# Patient Record
Sex: Female | Born: 2000 | Race: White | Hispanic: No | Marital: Single | Smoking: Never smoker
Health system: Southern US, Community
[De-identification: ages and names within clinical notes are randomized; demographics above are authoritative.]

## PROBLEM LIST (undated history)

## (undated) DIAGNOSIS — A6920 Lyme disease, unspecified: Secondary | ICD-10-CM

## (undated) DIAGNOSIS — H5500 Unspecified nystagmus: Secondary | ICD-10-CM

## (undated) DIAGNOSIS — G43909 Migraine, unspecified, not intractable, without status migrainosus: Secondary | ICD-10-CM

## (undated) DIAGNOSIS — G930 Cerebral cysts: Secondary | ICD-10-CM

## (undated) DIAGNOSIS — F909 Attention-deficit hyperactivity disorder, unspecified type: Secondary | ICD-10-CM

## (undated) DIAGNOSIS — F0781 Postconcussional syndrome: Secondary | ICD-10-CM

## (undated) DIAGNOSIS — R000 Tachycardia, unspecified: Secondary | ICD-10-CM

## (undated) HISTORY — PX: OTHER SURGICAL HISTORY: SHX169

---
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
## 2018-01-29 DIAGNOSIS — J3081 Allergic rhinitis due to animal (cat) (dog) hair and dander: Secondary | ICD-10-CM | POA: Insufficient documentation

## 2018-01-29 DIAGNOSIS — J4530 Mild persistent asthma, uncomplicated: Secondary | ICD-10-CM | POA: Insufficient documentation

## 2019-02-14 ENCOUNTER — Ambulatory Visit: Payer: Self-pay | Admitting: Internal Medicine

## 2019-02-16 DIAGNOSIS — F909 Attention-deficit hyperactivity disorder, unspecified type: Secondary | ICD-10-CM | POA: Insufficient documentation

## 2019-07-01 DIAGNOSIS — H532 Diplopia: Secondary | ICD-10-CM | POA: Insufficient documentation

## 2019-07-01 DIAGNOSIS — H538 Other visual disturbances: Secondary | ICD-10-CM | POA: Insufficient documentation

## 2019-07-01 DIAGNOSIS — R519 Headache, unspecified: Secondary | ICD-10-CM | POA: Insufficient documentation

## 2019-08-23 ENCOUNTER — Other Ambulatory Visit: Payer: Self-pay

## 2019-08-23 ENCOUNTER — Ambulatory Visit: Payer: Self-pay | Admitting: Family Medicine

## 2019-08-23 ENCOUNTER — Encounter: Payer: Self-pay | Admitting: Family Medicine

## 2019-08-23 DIAGNOSIS — Z113 Encounter for screening for infections with a predominantly sexual mode of transmission: Secondary | ICD-10-CM

## 2019-08-23 LAB — WET PREP FOR TRICH, YEAST, CLUE
Trichomonas Exam: NEGATIVE
Yeast Exam: NEGATIVE

## 2019-08-23 NOTE — Progress Notes (Signed)
[place]et [place] results reviewed. No treatment indicated per standing orders. Muriel Hannold, RN  

## 2019-08-23 NOTE — Progress Notes (Signed)
[facility] Department STI clinic/screening visit  Subjective:  Katrina Collins is a 19 y.o. female being seen today for No chief complaint on file.    The patient reports they do not have symptoms. Patient reports that they do not desire a pregnancy in the next year. They reported they are not interested in discussing contraception today.   Patient has the following medical conditions:   Patient Active Problem List   Diagnosis Date Noted  . Blurred vision 07/01/2019  . Diplopia 07/01/2019  . Headache disorder 07/01/2019  . Allergic rhinitis due to animal hair and dander 01/29/2018  . Mild persistent asthma without complication 01/29/2018    HPI  Pt reports she is here for STI screening. Denies symptoms.   See flowsheet for further details and programmatic requirements.    No LMP recorded (approximate). Last sex: May 28 BCM: Nexplanon Desires EC? n/a  Last pap per pt/review of record: never - states she will see PCP for this Last HIV test per pt/review of record: 2019  Last tetanus vaccine: a few years ago per pt   No components found for: HCV  The following portions of the patient's history were reviewed and updated as appropriate: allergies, current medications, past medical history, past social history, past surgical history and problem list.  Objective:  There were no vitals filed for this visit.   Physical Exam Vitals and nursing note reviewed.  Constitutional:      Appearance: Normal appearance.  HENT:     Head: Normocephalic and atraumatic.     Mouth/Throat:     Mouth: Mucous membranes are moist.     Pharynx: Oropharynx is clear. No oropharyngeal exudate or posterior oropharyngeal erythema.  Pulmonary:     Effort: Pulmonary effort is normal.  Abdominal:     General: Abdomen is flat.     Palpations: There is no mass.     Tenderness: There is no abdominal tenderness. There is no rebound.  Genitourinary:    General: Normal vulva.     Exam  position: Lithotomy position.     Pubic Area: No rash or pubic lice.      Labia:        Right: No rash or lesion.        Left: No rash or lesion.      Vagina: Vaginal discharge (scant, white, curdlike, ph<4.5) present. No erythema, bleeding or lesions.     Cervix: No cervical motion tenderness, discharge, friability, lesion or erythema.     Uterus: Normal.      Adnexa: Right adnexa normal and left adnexa normal.     Rectum: Normal.  Lymphadenopathy:     Head:     Right side of head: No preauricular or posterior auricular adenopathy.     Left side of head: No preauricular or posterior auricular adenopathy.     Cervical: No cervical adenopathy.     Upper Body:     Right upper body: No supraclavicular or axillary adenopathy.     Left upper body: No supraclavicular or axillary adenopathy.     Lower Body: No right inguinal adenopathy. No left inguinal adenopathy.  Skin:    General: Skin is warm and dry.     Findings: No rash.  Neurological:     Mental Status: She is alert and oriented to person, place, and time.      Assessment and Plan:  Katrina Collins is a 19 y.o. female presenting to the [facility] Department for STI screening  1. Screening examination for venereal disease -Pt without symptoms. Screenings today as below. Treat wet prep per standing order. -Patient does not meet criteria for HepB, HepC Screening.  -Counseled on warning s/sx and when to seek care. Recommended condom use with all sex and discussed importance of condom use for STI prevention. - WET PREP FOR [place], YEAST, CLUE - Gonococcus culture - HIV [place] [facility] - Syphilis Serology, [place] [facility] - Chlamydia/Gonorrhea [place] [facility]     Return for screening as needed.  No future appointments.  Kandee Keen, PA-C

## 2019-08-27 LAB — GONOCOCCUS CULTURE

## 2019-12-30 ENCOUNTER — Encounter: Payer: Self-pay | Admitting: Emergency Medicine

## 2019-12-30 ENCOUNTER — Emergency Department
Admission: EM | Admit: 2019-12-30 | Discharge: 2019-12-30 | Disposition: A | Discharge status: Home / Self Care | Payer: BC Managed Care – PPO | Attending: Student in an Organized Health Care Education/Training Program | Admitting: Student in an Organized Health Care Education/Training Program

## 2019-12-30 ENCOUNTER — Other Ambulatory Visit: Payer: Self-pay

## 2019-12-30 DIAGNOSIS — J4530 Mild persistent asthma, uncomplicated: Secondary | ICD-10-CM | POA: Insufficient documentation

## 2019-12-30 DIAGNOSIS — F909 Attention-deficit hyperactivity disorder, unspecified type: Secondary | ICD-10-CM | POA: Insufficient documentation

## 2019-12-30 DIAGNOSIS — G43119 Migraine with aura, intractable, without status migrainosus: Secondary | ICD-10-CM | POA: Diagnosis not present

## 2019-12-30 DIAGNOSIS — Z79899 Other long term (current) drug therapy: Secondary | ICD-10-CM | POA: Diagnosis not present

## 2019-12-30 DIAGNOSIS — R519 Headache, unspecified: Secondary | ICD-10-CM | POA: Diagnosis present

## 2019-12-30 HISTORY — DX: Migraine, unspecified, not intractable, without status migrainosus: G43.909

## 2019-12-30 HISTORY — DX: Postconcussional syndrome: F07.81

## 2019-12-30 HISTORY — DX: Cerebral cysts: G93.0

## 2019-12-30 HISTORY — DX: Lyme disease, unspecified: A69.20

## 2019-12-30 HISTORY — DX: Attention-deficit hyperactivity disorder, unspecified type: F90.9

## 2019-12-30 HISTORY — DX: Unspecified nystagmus: H55.00

## 2019-12-30 MED ORDER — BUTALBITAL-APAP-CAFFEINE 50-325-40 MG PO TABS: 1.0000 | ORAL_TABLET | Freq: Four times a day (QID) | ORAL | 0 refills | Status: AC | PRN

## 2019-12-30 MED ORDER — KETOROLAC TROMETHAMINE 30 MG/ML IJ SOLN
30.0000 mg | Freq: Once | INTRAMUSCULAR | Status: AC
  Administered 2019-12-30: 30 mg via INTRAMUSCULAR
  Filled 2019-12-30: qty 1

## 2019-12-30 MED ORDER — PROMETHAZINE HCL 25 MG/ML IJ SOLN
25.0000 mg | Freq: Once | INTRAMUSCULAR | Status: AC
  Administered 2019-12-30: 25 mg via INTRAMUSCULAR
  Filled 2019-12-30: qty 1

## 2019-12-30 MED ORDER — DIPHENHYDRAMINE HCL 50 MG/ML IJ SOLN
50.0000 mg | Freq: Once | INTRAMUSCULAR | Status: AC
  Administered 2019-12-30: 50 mg via INTRAMUSCULAR
  Filled 2019-12-30: qty 1

## 2019-12-30 NOTE — ED Triage Notes (Signed)
Pt presents to ED via POV with c/o migraine. Pt states migraine x several days with c/o light sensitivity, "floaters" and nausea with hx of same. Pt states has been taking Imitrex without relief.

## 2019-12-30 NOTE — ED Provider Notes (Signed)
[facility] Emergency Department Provider Note  ____________________________________________  Time seen: Approximately 8:06 PM  I have reviewed the triage vital signs and the nursing notes.   HISTORY  Chief Complaint Migraine    HPI [place] Katrina Collins is a 19 y.o. female who presents emergency department complaining of a migraine headache.  Patient states that she has a history of migraines, has had a migraine for the past 2 days unrelieved by her sumatriptan.  Patient states that she has had some nausea, photophobia.  Patient with blurred vision to the right eye which is consistent with her migraines.  No numbness and tingling or weakness on one side of body or the other.  Patient has seen neurology in the past for her headaches.  Typically well managed with her medications but did not relieve with her Imitrex and she presents to the emergency department for evaluation.  No recent illnesses.  No fevers or chills, neck pain or stiffness, chest pain, shortness of breath, abdominal pain, emesis, diarrhea or constipation.  Some nausea with headache.        Past Medical History:  Diagnosis Date   ADHD    Arachnoid cyst    Lyme disease    Migraines    Nystagmus    Post concussive syndrome     Patient Active Problem List   Diagnosis Date Noted   Blurred vision 07/01/2019   Diplopia 07/01/2019   Headache disorder 07/01/2019   Allergic rhinitis due to animal hair and dander 01/29/2018   Mild persistent asthma without complication 01/29/2018    Past Surgical History:  Procedure Laterality Date   Umbilical Hiatal Hernia Repair      Prior to Admission medications   Medication Sig Start Date End Date Taking? Authorizing Provider  amphetamine-dextroamphetamine (ADDERALL) 10 MG tablet Take by mouth. 06/12/19   [provider]  butalbital-acetaminophen-caffeine (FIORICET) 50-325-40 MG tablet Take 1 tablet by mouth every 6 (six) hours as needed  for headache. 12/30/19 12/29/20  Rigo Letts, Delorise Royals, PA-C  etonogestrel (NEXPLANON) 68 MG IMPL implant Inject into the skin.    [provider]  levalbuterol Pauline Aus) 1.25 MG/3ML nebulizer solution Inhale into the lungs. 01/29/18   [provider]  loratadine (CLARITIN) 10 MG tablet Take by mouth.    [provider]  nortriptyline (PAMELOR) 10 MG capsule Start Nortriptyline (Pamelor) 10 mg nightly for one week, then increase to 20 mg nightly 07/01/19   [provider]    Allergies Amoxicillin-pot clavulanate  History reviewed. No pertinent family history.  Social History Social History   Tobacco Use   Smoking status: Never Smoker   Smokeless tobacco: Never Used  Building services engineer Use: Never used  Substance Use Topics   Alcohol use: Yes    Alcohol/week: 5.0 standard drinks    Types: 2 Glasses of wine, 2 Cans of beer, 1 Shots of liquor per week   Drug use: Never     Review of Systems  Constitutional: No fever/chills Eyes: No visual changes. No discharge ENT: No upper respiratory complaints. Cardiovascular: no chest pain. Respiratory: no cough. No SOB. Gastrointestinal: No abdominal pain.  No nausea, no vomiting.  No diarrhea.  No constipation. Musculoskeletal: Negative for musculoskeletal pain. Skin: Negative for rash, abrasions, lacerations, ecchymosis. Neurological: Positive for right-sided migraine.  Denies focal weakness or numbness.  10 System ROS otherwise negative.  ____________________________________________   PHYSICAL EXAM:  VITAL SIGNS: ED Triage Vitals [12/30/19 1854]  Enc Vitals Group  BP 117/87     Pulse Rate 64     Resp 20     Temp 98.3 F (36.8 C)     Temp Source Oral     SpO2 96 %     Weight 160 lb (72.6 kg)     Height 5\' 7"  (1.702 m)     Head Circumference      Peak Flow      Pain Score 7     Pain Loc      Pain Edu?      Excl. in GC?      Constitutional: Alert and oriented. Well  appearing and in no acute distress. Eyes: Conjunctivae are normal. PERRL. EOMI. Head: Atraumatic. ENT:      Ears:       Nose: No congestion/rhinnorhea.      Mouth/Throat: Mucous membranes are moist.  Neck: No stridor.  No midline cervical spine tenderness to palpation.  Mild right paraspinal muscle tenderness at the insertion site of the occipital skull. Hematological/Lymphatic/Immunilogical: No cervical lymphadenopathy. Cardiovascular: Normal rate, regular rhythm. Normal S1 and S2.  Good peripheral circulation. Respiratory: Normal respiratory effort without tachypnea or retractions. Lungs CTAB. Good air entry to the bases with no decreased or absent breath sounds. Musculoskeletal: Full range of motion to all extremities. No gross deformities appreciated. Neurologic:  Normal speech and language. No gross focal neurologic deficits are appreciated.  Cranial nerves II through XII grossly intact.  Negative Romberg's and pronator drift.  Equal grip strength bilaterally. Skin:  Skin is warm, dry and intact. No rash noted. Psychiatric: Mood and affect are normal. Speech and behavior are normal. Patient exhibits appropriate insight and judgement.   ____________________________________________   LABS (all labs ordered are listed, but only abnormal results are displayed)  Labs Reviewed - No data to display ____________________________________________  EKG   ____________________________________________  RADIOLOGY   No results found.  ____________________________________________    PROCEDURES  Procedure(s) performed:    Procedures    Medications  ketorolac (TORADOL) 30 MG/ML injection 30 mg (has no administration in time range)  promethazine (PHENERGAN) injection 25 mg (has no administration in time range)  diphenhydrAMINE (BENADRYL) injection 50 mg (has no administration in time range)     ____________________________________________   INITIAL IMPRESSION / ASSESSMENT  AND PLAN / ED COURSE  Pertinent labs & imaging results that were available during my care of the patient were reviewed by me and considered in my medical decision making (see chart for details).  Review of the [place] CSRS was performed in accordance of the [facility] prior to dispensing any controlled drugs.           Patient's diagnosis is consistent with migraine headache.  Patient presented to the emergency department complaining of 2-day history of migraines and not responding to Imitrex.  Patient states that she has seen neurology in the past, has been diagnosed with chronic migraines.  This is similar to previous migraines.  No concerning signs or symptoms.  Physical exam is reassuring with patient being neurologically intact.  I reviewed patient's medical record with her most recent visit with neurology on 07/01/2019.  Patient does have a history of migraines and has been placed on Imitrex by neurology.  Today exam is reassuring, no indication for labs or imaging.  Patient will be given migraine cocktail for symptomatic control.  Prescribed Fioricet for breakthrough headaches.  Follow-up with neurology as needed.  Return precautions discussed with the patient. Patient is given ED precautions to return to  the ED for any worsening or new symptoms.     ____________________________________________  FINAL CLINICAL IMPRESSION(S) / ED DIAGNOSES  Final diagnoses:  Intractable migraine with aura without status migrainosus      NEW MEDICATIONS STARTED DURING THIS VISIT:  ED Discharge Orders         Ordered    butalbital-acetaminophen-caffeine (FIORICET) 50-325-40 MG tablet  Every 6 hours PRN        12/30/19 2026              This chart was dictated using voice recognition software/Dragon. Despite best efforts to proofread, errors can occur which can change the meaning. Any change was purely unintentional.    Racheal Patches, PA-C 12/30/19 2027    Willy Eddy,  MD 12/30/19 2028

## 2019-12-30 NOTE — ED Notes (Signed)
See triage note. Pt c/o migraine that is unrelieved by PRN medication. Pt stating she normally has floaters and blurry vision but feels they are worse this time. Pt stating the only thing she can think of is she hit her head getting out of her car the other day.

## 2020-01-10 ENCOUNTER — Ambulatory Visit: Payer: Self-pay

## 2020-01-10 NOTE — Telephone Encounter (Signed)
Mom reports pt. Had an injection 12/30/19 to left thigh at ED. Has had soreness and numbness at the sire since. Pt. Will go to student health.Instructed to to to ED for worsening of symptoms.  Reason for Disposition . [1] MODERATE pain (e.g., interferes with normal activities, limping) AND [2] present > 3 days  Answer Assessment - Initial Assessment Questions 1. ONSET: "When did the pain start?"      12/30/19 2. LOCATION: "Where is the pain located?"      Left thigh 3. PAIN: "How bad is the pain?"    (Scale 1-10; or mild, moderate, severe)   -  MILD (1-3): doesn't interfere with normal activities    -  MODERATE (4-7): interferes with normal activities (e.g., work or school) or awakens from sleep, limping    -  SEVERE (8-10): excruciating pain, unable to do any normal activities, unable to walk     Mild 4. WORK OR EXERCISE: "Has there been any recent work or exercise that involved this part of the body?"      No 5. CAUSE: "What do you think is causing the leg pain?"     Maybe from an injection 6. OTHER SYMPTOMS: "Do you have any other symptoms?" (e.g., chest pain, back pain, breathing difficulty, swelling, rash, fever, numbness, weakness)     No 7. PREGNANCY: "Is there any chance you are pregnant?" "When was your last menstrual period?"     No  Protocols used: LEG PAIN-A-AH

## 2020-05-15 ENCOUNTER — Encounter: Payer: Self-pay | Admitting: Emergency Medicine

## 2020-05-15 ENCOUNTER — Other Ambulatory Visit: Payer: Self-pay

## 2020-05-15 ENCOUNTER — Emergency Department: Payer: BC Managed Care – PPO

## 2020-05-15 ENCOUNTER — Emergency Department
Admission: EM | Admit: 2020-05-15 | Discharge: 2020-05-15 | Disposition: A | Discharge status: Home / Self Care | Payer: BC Managed Care – PPO | Attending: Student in an Organized Health Care Education/Training Program | Admitting: Student in an Organized Health Care Education/Training Program

## 2020-05-15 DIAGNOSIS — Z8679 Personal history of other diseases of the circulatory system: Secondary | ICD-10-CM | POA: Diagnosis not present

## 2020-05-15 DIAGNOSIS — R079 Chest pain, unspecified: Secondary | ICD-10-CM | POA: Insufficient documentation

## 2020-05-15 DIAGNOSIS — R002 Palpitations: Secondary | ICD-10-CM | POA: Diagnosis not present

## 2020-05-15 HISTORY — DX: Tachycardia, unspecified: R00.0

## 2020-05-15 LAB — COMPREHENSIVE METABOLIC PANEL
ALT: 18 U/L (ref 0–44)
AST: 21 U/L (ref 15–41)
Albumin: 4.9 g/dL (ref 3.5–5.0)
Alkaline Phosphatase: 56 U/L (ref 38–126)
Anion gap: 7 (ref 5–15)
BUN: 8 mg/dL (ref 6–20)
CO2: 24 mmol/L (ref 22–32)
Calcium: 9.7 mg/dL (ref 8.9–10.3)
Chloride: 108 mmol/L (ref 98–111)
Creatinine, Ser: 0.77 mg/dL (ref 0.44–1.00)
GFR, Estimated: >60 mL/min (ref >60)
Glucose, Bld: 99 mg/dL (ref 70–99)
Potassium: 3.9 mmol/L (ref 3.5–5.1)
Sodium: 139 mmol/L (ref 135–145)
Total Bilirubin: 0.7 mg/dL (ref 0.3–1.2)
Total Protein: 7.3 g/dL (ref 6.5–8.1)

## 2020-05-15 LAB — TROPONIN I (HIGH SENSITIVITY)
Troponin I (High Sensitivity): <2 ng/L (ref <18)
Troponin I (High Sensitivity): <2 ng/L (ref <18)

## 2020-05-15 LAB — CBC
HCT: 40.4 % (ref 36.0–46.0)
Hemoglobin: 13.7 g/dL (ref 12.0–15.0)
MCH: 30.3 pg (ref 26.0–34.0)
MCHC: 33.9 g/dL (ref 30.0–36.0)
MCV: 89.4 fL (ref 80.0–100.0)
Platelets: 268 10*3/uL (ref 150–400)
RBC: 4.52 MIL/uL (ref 3.87–5.11)
RDW: 11.6 % (ref 11.5–15.5)
WBC: 8.8 10*3/uL (ref 4.0–10.5)
nRBC: 0 % (ref 0.0–0.2)

## 2020-05-15 LAB — D-DIMER, QUANTITATIVE: D-Dimer, Quant: <0.27 ug/mL-FEU (ref 0.00–0.50)

## 2020-05-15 NOTE — ED Provider Notes (Signed)
[facility] Emergency Department Provider Note    Event Date/Time   First MD Initiated Contact with Patient 05/15/20 0501     (approximate)  I have reviewed the triage vital signs and the nursing notes.   HISTORY  Chief Complaint Palpitations and Chest Pain    HPI [place] Katrina Collins is a 20 y.o. female the below listed past medical history presents to the ER for evaluation of chest pain and irregular heartbeat.  States it is intermittent.  Typically occurs at night.  Was seen in the ER due to for similar symptoms on the ninth.  She supposed to have outpatient follow-up with cardiology.  Was previously on propranolol but stopped taking it because her heart rate was getting too low.  Did feel a little bit of dizziness.  Denies any numbness or tingling.  No recent fevers.  No shortness of breath.  No new medications.    Past Medical History:  Diagnosis Date  . ADHD   . Arachnoid cyst   . Lyme disease   . Migraines   . Nystagmus   . Post concussive syndrome   . Tachycardia    No family history on file. Past Surgical History:  Procedure Laterality Date  . Umbilical Hiatal Hernia Repair     Patient Active Problem List   Diagnosis Date Noted  . Blurred vision 07/01/2019  . Diplopia 07/01/2019  . Headache disorder 07/01/2019  . Allergic rhinitis due to animal hair and dander 01/29/2018  . Mild persistent asthma without complication 01/29/2018      Prior to Admission medications   Medication Sig Start Date End Date Taking? Authorizing Provider  amphetamine-dextroamphetamine (ADDERALL) 10 MG tablet Take by mouth. 06/12/19   [provider]  butalbital-acetaminophen-caffeine (FIORICET) 50-325-40 MG tablet Take 1 tablet by mouth every 6 (six) hours as needed for headache. 12/30/19 12/29/20  Cuthriell, Delorise Royals, PA-C  etonogestrel (NEXPLANON) 68 MG IMPL implant Inject into the skin.    [provider]  levalbuterol Pauline Aus) 1.25 MG/3ML  nebulizer solution Inhale into the lungs. 01/29/18   [provider]  loratadine (CLARITIN) 10 MG tablet Take by mouth.    [provider]  nortriptyline (PAMELOR) 10 MG capsule Start Nortriptyline (Pamelor) 10 mg nightly for one week, then increase to 20 mg nightly 07/01/19   [provider]    Allergies Amoxicillin-pot clavulanate    Social History Social History   Tobacco Use  . Smoking status: Never Smoker  . Smokeless tobacco: Never Used  Vaping Use  . Vaping Use: Never used  Substance Use Topics  . Alcohol use: Yes    Alcohol/week: 5.0 standard drinks    Types: 2 Glasses of wine, 2 Cans of beer, 1 Shots of liquor per week  . Drug use: Never    Review of Systems Patient denies headaches, rhinorrhea, blurry vision, numbness, shortness of breath, chest pain, edema, cough, abdominal pain, nausea, vomiting, diarrhea, dysuria, fevers, rashes or hallucinations unless otherwise stated above in HPI. ____________________________________________   PHYSICAL EXAM:  VITAL SIGNS: Vitals:   05/15/20 0504 05/15/20 0634  BP: 118/85 112/73  Pulse: 88 70  Resp: 20 18  Temp:    SpO2: 100% 100%    Constitutional: Alert and oriented.  Eyes: Conjunctivae are normal.  Head: Atraumatic. Nose: No congestion/rhinnorhea. Mouth/Throat: Mucous membranes are moist.   Neck: No stridor. Painless ROM.  Cardiovascular: Normal rate, regular rhythm. Grossly normal heart sounds.  Good peripheral circulation. Respiratory: Normal respiratory effort.  No  retractions. Lungs CTAB. Gastrointestinal: Soft and nontender. No distention. No abdominal bruits. No CVA tenderness. Genitourinary:  Musculoskeletal: No lower extremity tenderness nor edema.  No joint effusions. Neurologic:  Normal speech and language. No gross focal neurologic deficits are appreciated. No facial droop Skin:  Skin is warm, dry and intact. No rash noted. Psychiatric: Mood and affect are normal. Speech and  behavior are normal.  ____________________________________________   LABS (all labs ordered are listed, but only abnormal results are displayed)  Results for orders placed or performed during the hospital encounter of 05/15/20 (from the past 24 hour(s))  CBC     Status: None   Collection Time: 05/15/20  2:53 AM  Result Value Ref Range   WBC 8.8 4.0 - 10.5 K/uL   RBC 4.52 3.87 - 5.11 MIL/uL   Hemoglobin 13.7 12.0 - 15.0 g/dL   HCT 56.9 79.4 - 80.1 %   MCV 89.4 80.0 - 100.0 fL   MCH 30.3 26.0 - 34.0 pg   MCHC 33.9 30.0 - 36.0 g/dL   RDW 65.5 37.4 - 82.7 %   Platelets 268 150 - 400 K/uL   nRBC 0.0 0.0 - 0.2 %  Comprehensive metabolic panel     Status: None   Collection Time: 05/15/20  2:53 AM  Result Value Ref Range   Sodium 139 135 - 145 mmol/L   Potassium 3.9 3.5 - 5.1 mmol/L   Chloride 108 98 - 111 mmol/L   CO2 24 22 - 32 mmol/L   Glucose, Bld 99 70 - 99 mg/dL   BUN 8 6 - 20 mg/dL   Creatinine, Ser 0.78 0.44 - 1.00 mg/dL   Calcium 9.7 8.9 - 67.5 mg/dL   Total Protein 7.3 6.5 - 8.1 g/dL   Albumin 4.9 3.5 - 5.0 g/dL   AST 21 15 - 41 U/L   ALT 18 0 - 44 U/L   Alkaline Phosphatase 56 38 - 126 U/L   Total Bilirubin 0.7 0.3 - 1.2 mg/dL   GFR, Estimated >44 >92 mL/min   Anion gap 7 5 - 15  Troponin I (High Sensitivity)     Status: None   Collection Time: 05/15/20  2:53 AM  Result Value Ref Range   Troponin I (High Sensitivity) <2 <18 ng/L  Troponin I (High Sensitivity)     Status: None   Collection Time: 05/15/20  5:15 AM  Result Value Ref Range   Troponin I (High Sensitivity) <2 <18 ng/L  D-dimer, quantitative     Status: None   Collection Time: 05/15/20  5:15 AM  Result Value Ref Range   D-Dimer, Quant <0.27 0.00 - 0.50 ug/mL-FEU   ____________________________________________  EKG My review and personal interpretation at Time: 2:49   Indication: palpitations  Rate: 95  Rhythm: sinus Axis: normal Other: normal intervals, no stemi or depressions, nonspecific st  abn ____________________________________________  RADIOLOGY  I personally reviewed all radiographic images ordered to evaluate for the above acute complaints and reviewed radiology reports and findings.  These findings were personally discussed with the patient.  Please see medical record for radiology report.  ____________________________________________   PROCEDURES  Procedure(s) performed:  Procedures    Critical Care performed: no ____________________________________________   INITIAL IMPRESSION / ASSESSMENT AND PLAN / ED COURSE  Pertinent labs & imaging results that were available during my care of the patient were reviewed by me and considered in my medical decision making (see chart for details).   DDX: ACS, pericarditis, esophagitis, boerhaaves, pe, dissection, pna,  bronchitis, costochondritis   [place] Jett is a 20 y.o. who presents to the ED with presentation as described above.  Patient nontoxic-appearing EKG nonischemic no sign of preexcitation syndrome.  Initial enzyme negative.  She is on birth control therefore will order D-dimer which is otherwise low risk by Wells criteria.    Clinical Course as of 05/15/20 0648  Fri May 15, 2020  0603 Repeat troponin negative.  D-dimer negative.  She is well-appearing perimenstrual dynamically stable.  She is sinus rhythm heart rate in the 60s on monitor.  CXR ordred to eval for ptx shows none.  Denies cough.  PA and Lateral CXR on 3/9 normal.  Do not believe repeat pa and lat indicated at this time. Do believe she is appropriate for close outpatient follow-up. [PR]    Clinical Course User Index [PR] Willy Eddy, MD    The patient was evaluated in Emergency Department today for the symptoms described in the history of present illness. He/she was evaluated in the context of the global COVID-19 pandemic, which necessitated consideration that the patient might be at risk for infection with the SARS-CoV-2 virus that causes  COVID-19. Institutional protocols and algorithms that pertain to the evaluation of patients at risk for COVID-19 are in a state of rapid change based on information released by regulatory bodies including the CDC and federal and state organizations. These policies and algorithms were followed during the patient's care in the ED.  As part of my medical decision making, I reviewed the following data within the electronic MEDICAL RECORD NUMBER Nursing notes reviewed and incorporated, Labs reviewed, notes from prior ED visits and [place] Controlled Substance Database   ____________________________________________   FINAL CLINICAL IMPRESSION(S) / ED DIAGNOSES  Final diagnoses:  Palpitations      NEW MEDICATIONS STARTED DURING THIS VISIT:  New Prescriptions   No medications on file     Note:  This document was prepared using Dragon voice recognition software and may include unintentional dictation errors.    Willy Eddy, MD 05/15/20 [phone]

## 2020-05-15 NOTE — Discharge Instructions (Addendum)
Follow up with PCP and cardiology.  Return for any new or worsening symptoms.

## 2020-05-15 NOTE — ED Triage Notes (Signed)
Pt in with co intermittent chest pain and palpitations, has been seen at [facility] for the same and discharged home with low sodium and follow up with cardio. Pt is also being tested for POTS.

## 2020-05-15 NOTE — ED Notes (Signed)
Pt verbally signed discharge paperwork

## 2020-10-27 ENCOUNTER — Encounter: Payer: Self-pay | Admitting: Physician Assistant

## 2020-10-27 ENCOUNTER — Other Ambulatory Visit: Payer: Self-pay

## 2020-10-27 ENCOUNTER — Ambulatory Visit: Payer: Self-pay | Admitting: Physician Assistant

## 2020-10-27 DIAGNOSIS — Z113 Encounter for screening for infections with a predominantly sexual mode of transmission: Secondary | ICD-10-CM

## 2020-10-27 DIAGNOSIS — B3731 Acute candidiasis of vulva and vagina: Secondary | ICD-10-CM

## 2020-10-27 DIAGNOSIS — B373 Candidiasis of vulva and vagina: Secondary | ICD-10-CM

## 2020-10-27 LAB — WET PREP FOR TRICH, YEAST, CLUE: Trichomonas Exam: NEGATIVE

## 2020-10-27 MED ORDER — CLOTRIMAZOLE 1 % VA CREA: 1.0000 | TOPICAL_CREAM | Freq: Every day | VAGINAL | 0 refills | Status: AC

## 2020-10-27 NOTE — Progress Notes (Signed)
[facility] Department STI clinic/screening visit  Subjective:  [place] Katrina Collins is a 20 y.o. female being seen today for an STI screening visit. The patient reports they do not have symptoms.  Patient reports that they do not desire a pregnancy in the next year.   They reported they are not interested in discussing contraception today.  No LMP recorded. Patient has had an implant.   Patient has the following medical conditions:   Patient Active Problem List   Diagnosis Date Noted   Blurred vision 07/01/2019   Diplopia 07/01/2019   Headache disorder 07/01/2019   Attention deficit hyperactivity disorder 02/16/2019   Allergic rhinitis due to animal hair and dander 01/29/2018   Mild persistent asthma without complication 01/29/2018    Chief Complaint  Patient presents with   SEXUALLY TRANSMITTED DISEASE    screening    HPI  Patient reports that she is not having any symptoms but would like a screening today.  Reports that she has Asthma, ADHD and possibly POTS and is under care at [facility] for the heart condition.  States that she takes medicines as directed for asthma and ADHD as well.  Reports last HIV test was in March or April of this year and last pap was in 01/2020.  States that she does not have a period with her Nexplanon.   See flowsheet for further details and programmatic requirements.    The following portions of the patient's history were reviewed and updated as appropriate: allergies, current medications, past medical history, past social history, past surgical history and problem list.  Objective:  There were no vitals filed for this visit.  Physical Exam Constitutional:      General: She is not in acute distress.    Appearance: Normal appearance.  HENT:     Head: Normocephalic and atraumatic.     Comments: No nits,lice, or hair loss. No cervical, supraclavicular or axillary adenopathy.     Mouth/Throat:     Mouth: Mucous membranes are moist.      Pharynx: Oropharynx is clear. No oropharyngeal exudate or posterior oropharyngeal erythema.  Eyes:     Conjunctiva/sclera: Conjunctivae normal.  Pulmonary:     Effort: Pulmonary effort is normal.  Abdominal:     Palpations: Abdomen is soft. There is no mass.     Tenderness: There is no abdominal tenderness. There is no guarding or rebound.  Genitourinary:    General: Normal vulva.     Rectum: Normal.     Comments: External genitalia/pubic area without nits, lice, edema, erythema, lesions and inguinal adenopathy. Vagina with normal mucosa and discharge. Cervix without visible lesions. Uterus firm, mobile, nt, no masses, no CMT, no adnexal tenderness or fullness.  Musculoskeletal:     Cervical back: Neck supple. No tenderness.  Skin:    General: Skin is warm and dry.     Findings: No bruising, erythema, lesion or rash.  Neurological:     Mental Status: She is alert and oriented to person, place, and time.  Psychiatric:        Mood and Affect: Mood normal.        Behavior: Behavior normal.        Thought Content: Thought content normal.        Judgment: Judgment normal.     Assessment and Plan:  [place] Katrina Collins is a 20 y.o. female presenting to the [facility] Department for STI screening  1. Screening for STD (sexually transmitted disease) Patient into clinic without symptoms.  Reviewed with patient wet mount results and offered treatment. Rec condoms with all sex. Await test results.  Counseled that RN will call if needs to RTC for treatment once results are back.  - WET PREP FOR TRICH, YEAST, CLUE - Gonococcus culture - Chlamydia/Gonorrhea [place] [facility] - HIV [facility] - Syphilis Serology,  Lab - Gonococcus culture  2. Candidiasis of vagina Patient opts to treat yeast with Clotrimazole 1% vaginal cream 1 app qhs for 7 days. No sex for 10 days. - clotrimazole (CLOTRIMAZOLE-7) 1 % vaginal cream; Place 1 Applicatorful vaginally at bedtime for 7 days.   Dispense: 45 g; Refill: 0     No follow-ups on file.  No future appointments.  Matt Holmes, PA

## 2020-10-31 LAB — GONOCOCCUS CULTURE

## 2021-12-19 IMAGING — DX DG CHEST 1V PORT
1 series · 1 of 1 positions shown · non-contrast
Comparison: None.

CLINICAL DATA: 20-year-old female with chest pain and palpitations.

EXAM:
PORTABLE CHEST 1 VIEW

[chest ap]
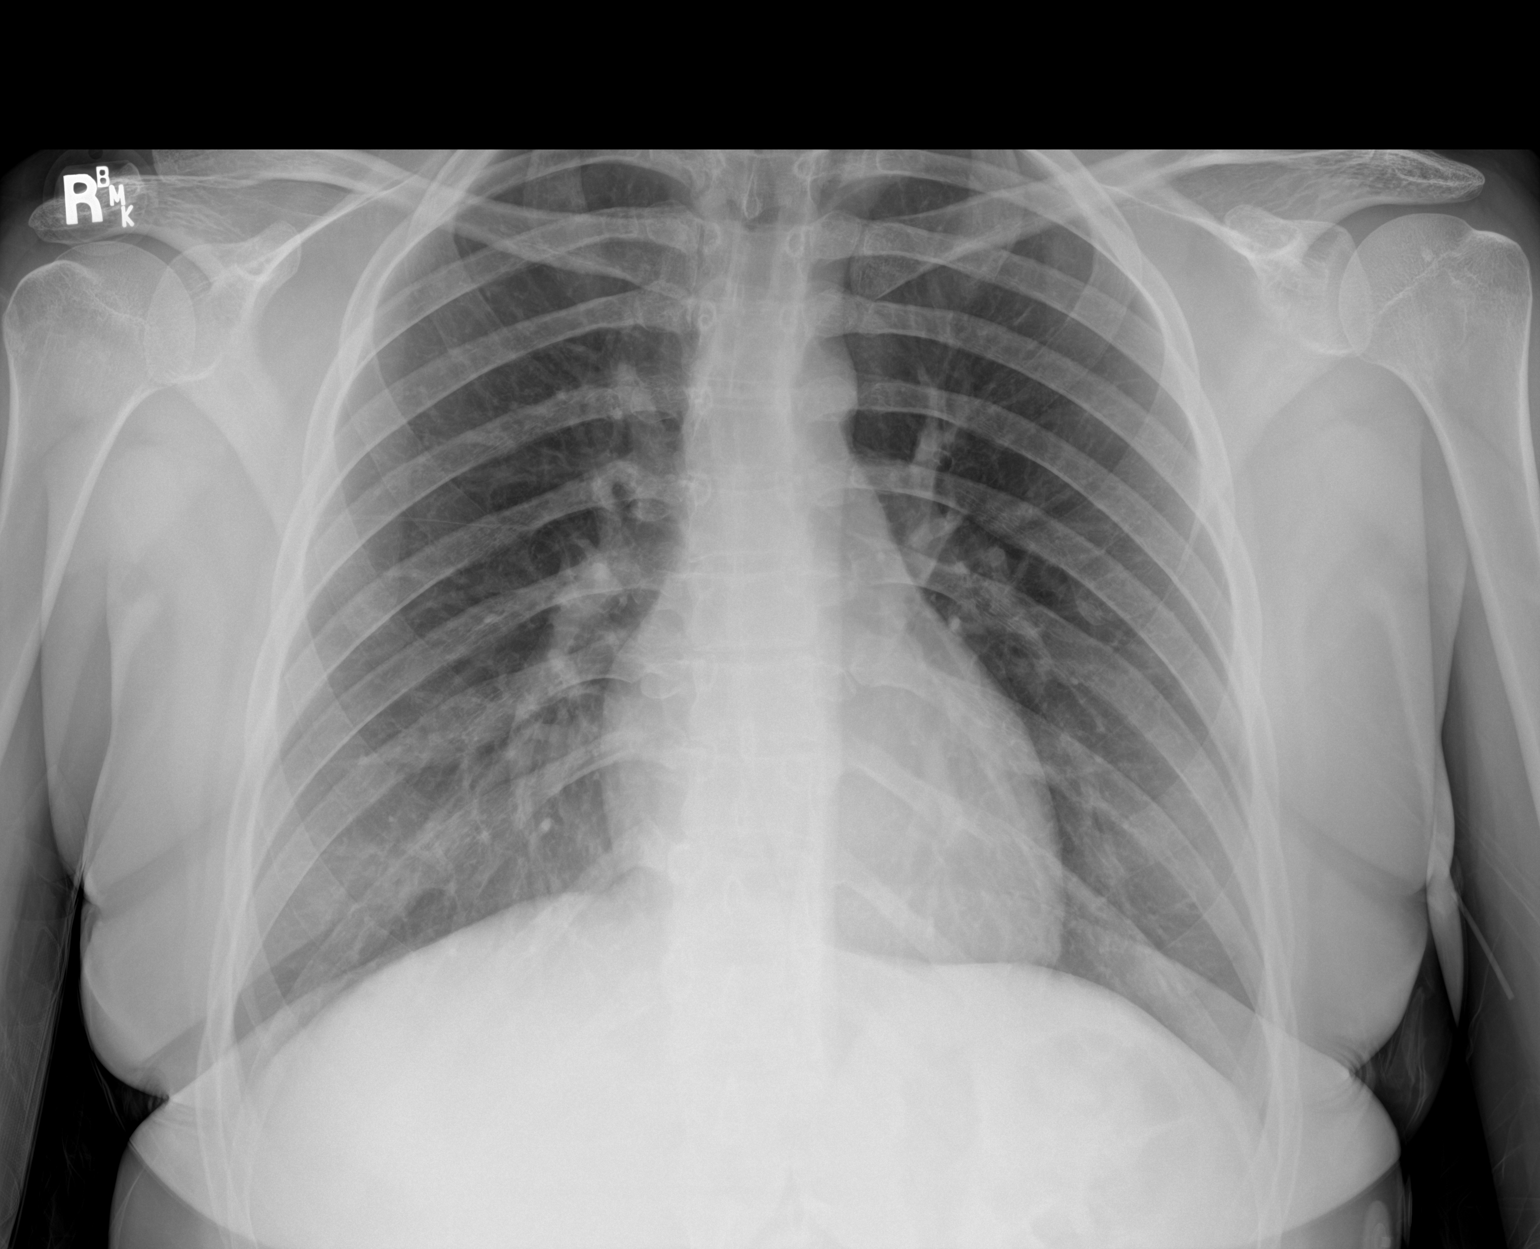

[1 of 1 positions shown; findings below may reference images not displayed]

FINDINGS: Portable AP upright view at [id number] hours. Normal lung volumes and
mediastinal contours. Visualized tracheal air column is within
normal limits. Both lungs appear clear aside from subtle asymmetric
peribronchial opacity in the right lower lung. No pneumothorax or
pleural effusion.

Minimal thoracic scoliosis. Otherwise negative visible osseous
structures. Negative visible bowel gas.
IMPRESSION: Normal aside from subtle asymmetric increased peribronchial opacity
in the right lower lung, which might be artifact. Recommend PA and
Lateral views of the Chest when feasible.
